# Patient Record
Sex: Male | Born: 1980 | Race: Black or African American | Hispanic: No | Marital: Single | Smoking: Current every day smoker
Health system: Southern US, Community
[De-identification: ages and names within clinical notes are randomized; demographics above are authoritative.]

---

## 2015-03-30 ENCOUNTER — Encounter (HOSPITAL_COMMUNITY): Payer: Self-pay | Admitting: Emergency Medicine

## 2015-03-30 ENCOUNTER — Emergency Department (HOSPITAL_COMMUNITY): Payer: Medicaid - Out of State

## 2015-03-30 ENCOUNTER — Emergency Department (HOSPITAL_COMMUNITY)
Admission: EM | Admit: 2015-03-30 | Discharge: 2015-03-31 | Disposition: A | Discharge status: Home / Self Care | Payer: Medicaid - Out of State | Attending: Emergency Medicine | Admitting: Emergency Medicine

## 2015-03-30 DIAGNOSIS — S6991XA Unspecified injury of right wrist, hand and finger(s), initial encounter: Secondary | ICD-10-CM | POA: Insufficient documentation

## 2015-03-30 DIAGNOSIS — M79641 Pain in right hand: Secondary | ICD-10-CM

## 2015-03-30 DIAGNOSIS — W010XXA Fall on same level from slipping, tripping and stumbling without subsequent striking against object, initial encounter: Secondary | ICD-10-CM | POA: Insufficient documentation

## 2015-03-30 DIAGNOSIS — Y9389 Activity, other specified: Secondary | ICD-10-CM | POA: Insufficient documentation

## 2015-03-30 DIAGNOSIS — Z79899 Other long term (current) drug therapy: Secondary | ICD-10-CM | POA: Insufficient documentation

## 2015-03-30 DIAGNOSIS — Y92091 Bathroom in other non-institutional residence as the place of occurrence of the external cause: Secondary | ICD-10-CM | POA: Insufficient documentation

## 2015-03-30 DIAGNOSIS — Y998 Other external cause status: Secondary | ICD-10-CM | POA: Insufficient documentation

## 2015-03-30 DIAGNOSIS — Z720 Tobacco use: Secondary | ICD-10-CM | POA: Diagnosis not present

## 2015-03-30 NOTE — ED Notes (Signed)
Pt from home c/o right hand pain from falling in the bathroom at the pool due to wet floors.  He denies hitting head or LOC. No swelling noted. Good radial pulse.

## 2015-03-31 MED ORDER — NAPROXEN SODIUM 220 MG PO TABS: 220.0000 mg | ORAL_TABLET | Freq: Two times a day (BID) | ORAL | Status: AC

## 2015-03-31 NOTE — Discharge Instructions (Signed)

## 2015-03-31 NOTE — ED Provider Notes (Signed)
CSN: [id number]     Arrival date & time 03/30/15  2202 History   First MD Initiated Contact with Patient 03/30/15 2325     Chief Complaint  Patient presents with  . Hand Injury     (Consider location/radiation/quality/duration/timing/severity/associated sxs/prior Treatment) HPI Jesus Love is a 34 y.o. male because of her evaluation of right hand pain. Patient states he was in a small hotel bathroom when he slipped on the tile floor and fell. Patient states his hand out to catch himself and states he hurt his thumb. He reports 8/10 pain. He has not done anything to improve his symptoms. He denies numbness or weakness. Denies any coolness or other injuries. No other aggravating or modifying factors.  History reviewed. No pertinent past medical history. History reviewed. No pertinent past surgical history. No family history on file. History  Substance Use Topics  . Smoking status: Current Every Day Smoker  . Smokeless tobacco: Not on file  . Alcohol Use: No    Review of Systems A 10 point review of systems was completed and was negative except for pertinent positives and negatives as mentioned in the history of present illness     Allergies  Review of patient's allergies indicates no known allergies.  Home Medications   Prior to Admission medications   Medication Sig Start Date End Date Taking? Authorizing Provider  albuterol (PROVENTIL HFA;VENTOLIN HFA) 108 (90 BASE) MCG/ACT inhaler Inhale 1 puff into the lungs every 6 (six) hours as needed for wheezing or shortness of breath.   Yes Historical Provider, MD  naproxen sodium (ALEVE) 220 MG tablet Take 1 tablet (220 mg total) by mouth 2 (two) times daily with a meal. 03/31/15   Joycie Peek, PA-C   BP 110/78 mmHg  Pulse 77  Temp(Src) 98.7 F (37.1 C) (Oral)  Resp 18  SpO2 100% Physical Exam  Constitutional:  Awake, alert, nontoxic appearance.  HENT:  Head: Atraumatic.  Eyes: Right eye exhibits no discharge. Left eye  exhibits no discharge.  Neck: Neck supple.  Pulmonary/Chest: Effort normal. He exhibits no tenderness.  Abdominal: Soft. There is no tenderness. There is no rebound.  Musculoskeletal: He exhibits no tenderness.  Mild discomfort over right thenar eminence with extension of right thumb. No bony tenderness. Maintains full passive range of motion. Full active range of motion of right wrist. No other lesions or deformities noted  Neurological:  Mental status and motor strength appears baseline for patient and situation.  Skin: No rash noted.  Psychiatric: He has a normal mood and affect.  Nursing note and vitals reviewed.   ED Course  Procedures (including critical care time) Labs Review Labs Reviewed - No data to display  Imaging Review Dg Hand Complete Right  03/30/2015   CLINICAL DATA:  34 year old male with injury to the right hand.  EXAM: RIGHT HAND - COMPLETE 3+ VIEW  COMPARISON:  None.  FINDINGS: There is no evidence of fracture or dislocation. There is no evidence of arthropathy or other focal bone abnormality. Soft tissues are unremarkable.  IMPRESSION: No acute/ traumatic osseous pathology.   Electronically Signed   By: Elgie Collard M.D.   On: 03/30/2015 23:02     EKG Interpretation None     Meds given in ED:  Medications - No data to display  New Prescriptions   NAPROXEN SODIUM (ALEVE) 220 MG TABLET    Take 1 tablet (220 mg total) by mouth 2 (two) times daily with a meal.    MDM  Vitals  stable - WNL -afebrile Pt resting comfortably in ED. PE--normal neuro exam. Physical exam otherwise not concerning. Imaging--plain films of right hand negative for any acute fracture or dislocation.  DDX--patient would likely sprain secondary to hyperextension of the thumb. No evidence of tendon or ligament rupture. Discussed symptomatic care home with anti-inflammatory's, ice.  I discussed all relevant lab findings and imaging results with pt and they verbalized  understanding. Discussed f/u with PCP within 48 hrs and return precautions, pt very amenable to plan.  Final diagnoses:  Pain of right hand       Joycie Peek, PA-C 03/31/15 0023  April Palumbo, MD 03/31/15 [phone]

## 2016-04-05 IMAGING — CR DG HAND COMPLETE 3+V*R*
3 series · 3 of 3 positions shown · non-contrast
Comparison: None.

CLINICAL DATA: 33-year-old male with injury to the right hand.

EXAM:
RIGHT HAND - COMPLETE 3+ VIEW

[x hand pa right]
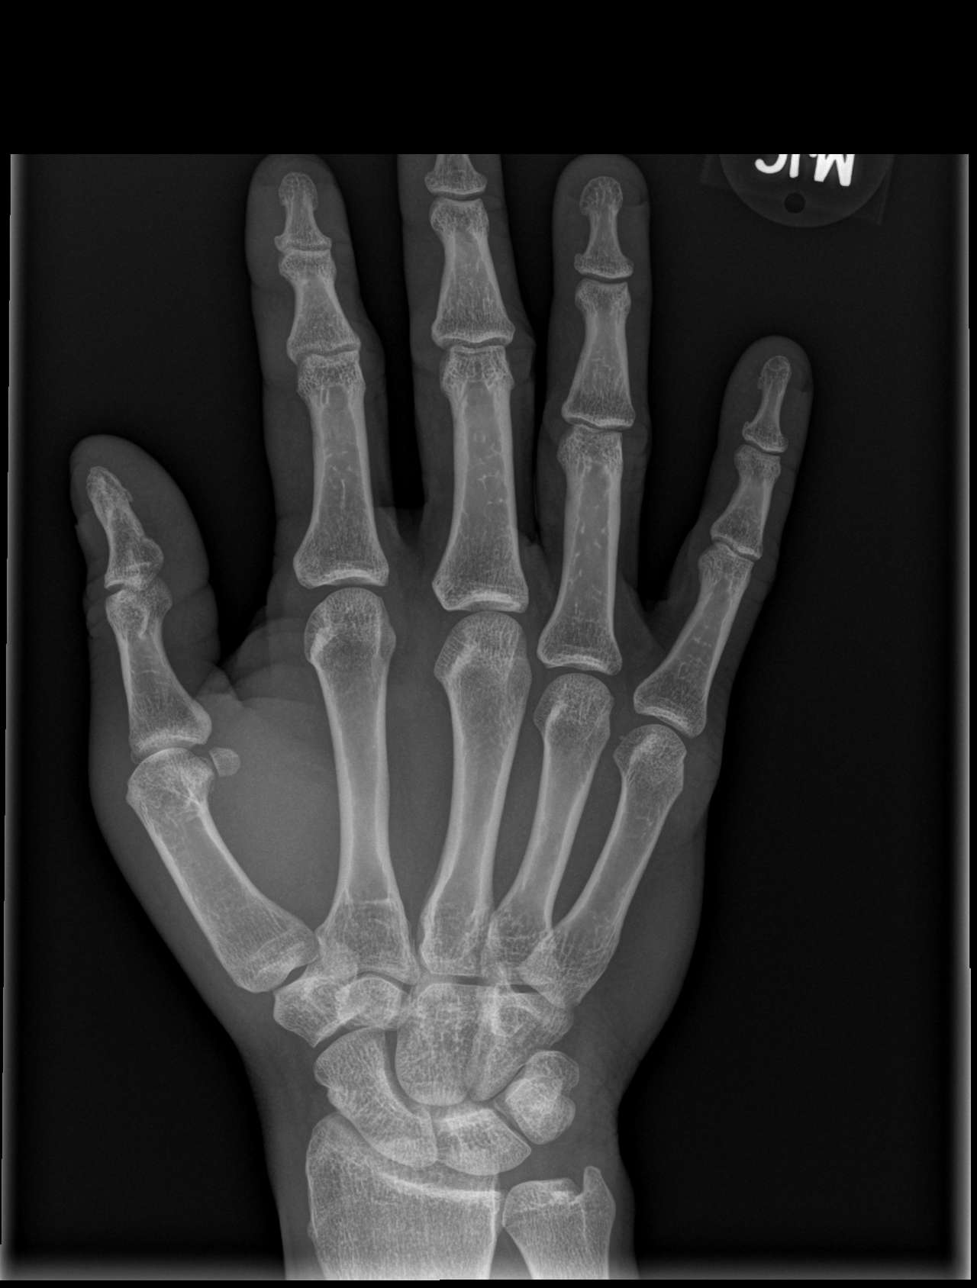

[x hand obl right]
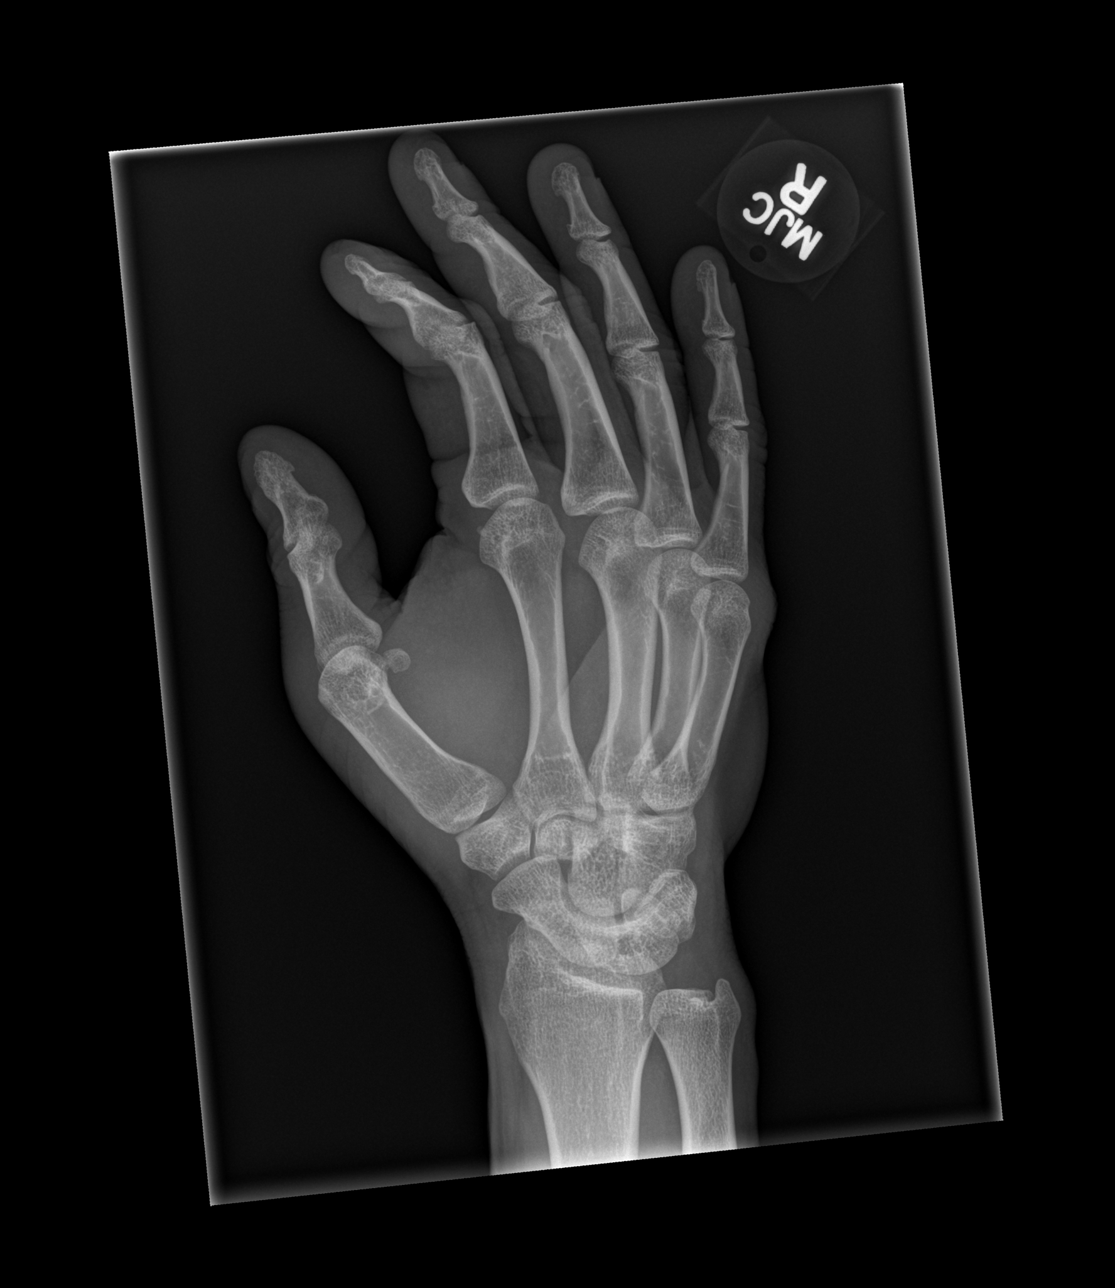

[x hand lat right]
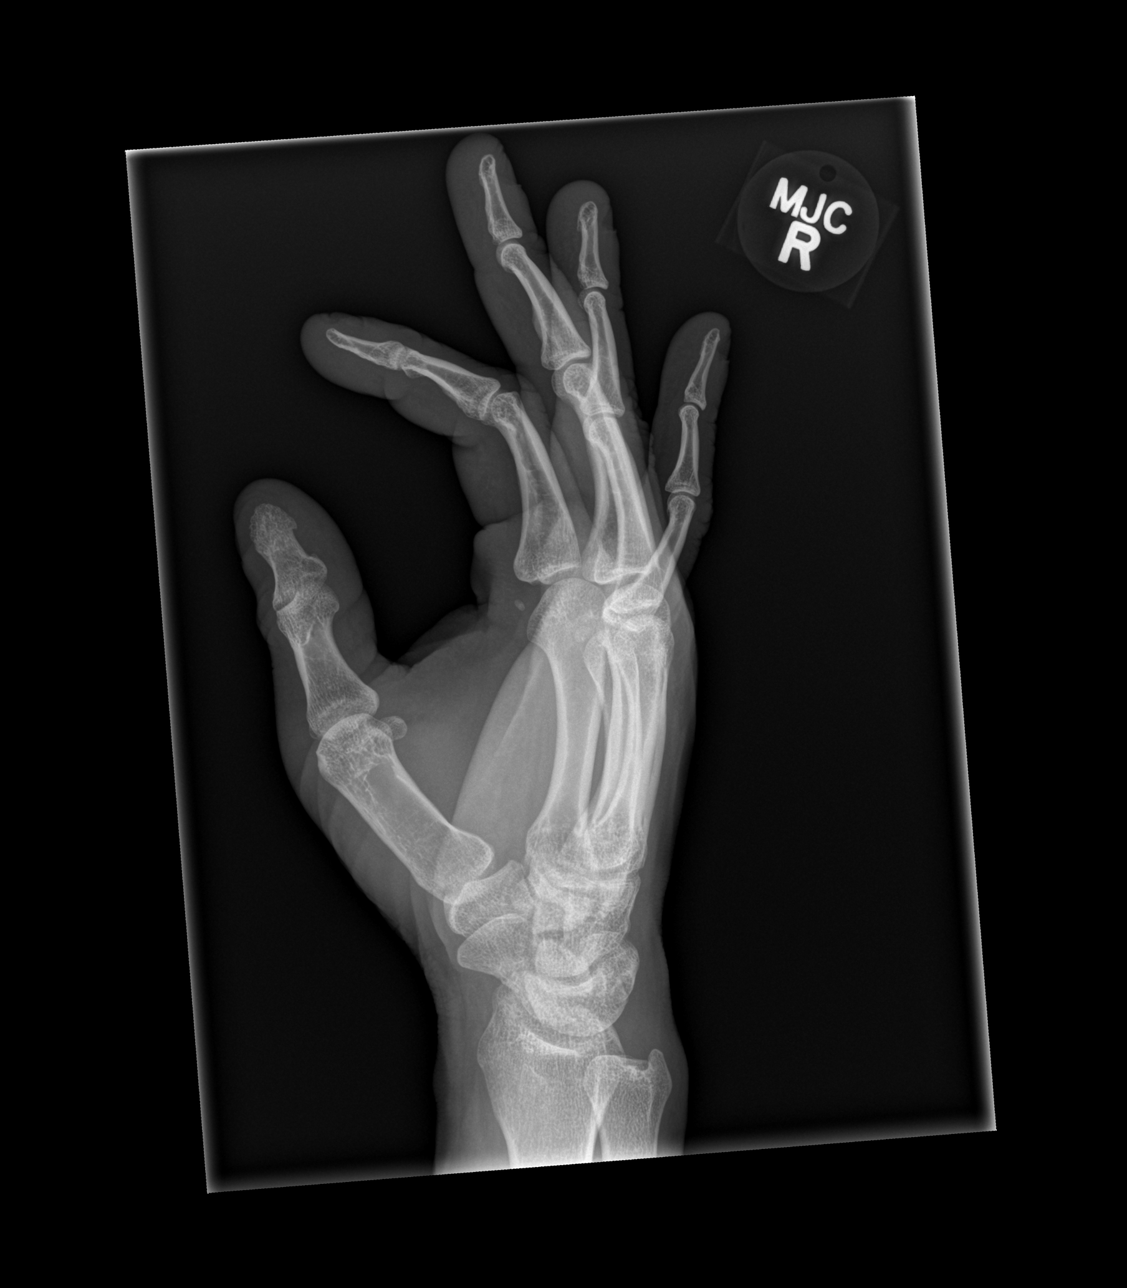

[3 of 3 positions shown; findings below may reference images not displayed]

FINDINGS: There is no evidence of fracture or dislocation. There is no
evidence of arthropathy or other focal bone abnormality. Soft
tissues are unremarkable.
IMPRESSION: No acute/ traumatic osseous pathology.
